# Patient Record
Sex: Female | Born: 1985 | Race: Black or African American | Hispanic: No | Marital: Single | Smoking: Never smoker
Health system: Southern US, Community
[De-identification: ages and names within clinical notes are randomized; demographics above are authoritative.]

## PROBLEM LIST (undated history)

## (undated) ENCOUNTER — Inpatient Hospital Stay (HOSPITAL_COMMUNITY): Payer: Self-pay

## (undated) HISTORY — PX: WISDOM TOOTH EXTRACTION: SHX21

---

## 2004-03-18 ENCOUNTER — Ambulatory Visit (HOSPITAL_COMMUNITY): Admission: RE | Admit: 2004-03-18 | Discharge: 2004-03-18 | Payer: Self-pay | Admitting: Urology

## 2005-03-18 IMAGING — RF DG VCUG
15 of 18 series · 15 of 18 positions shown · non-contrast
Comparison: none

CLINICAL DATA: Polyuria.  No evidence of urinary tract infections.
 RETROGRADE CYSTOGRAM
 The patient was catheterized with a Foley catheter.  The urinary bladder is well filled.  Patient experienced bladder fullness with only 200 cc being infused.  Approximately 300 cc was infused and the patient then voided around the catheter.  Bladder wall appears to be normal.  No irregularity of the bladder wall.  No reflux into the ureters.  Patient almost completely voided on the table.  Patient was then sent to the bathroom and voided.  Post void film reveals complete emptying of the bladder.  
 IMPRESSION
 No abnormality of the bladder with satisfactory voiding.   No reflux.    
 The patient did have a sense of the bladder being full with only 200 cc being infused into the bladder.

[Series 1: run · 1 of 1 slices shown (1 of 13)]
[im 1/1]
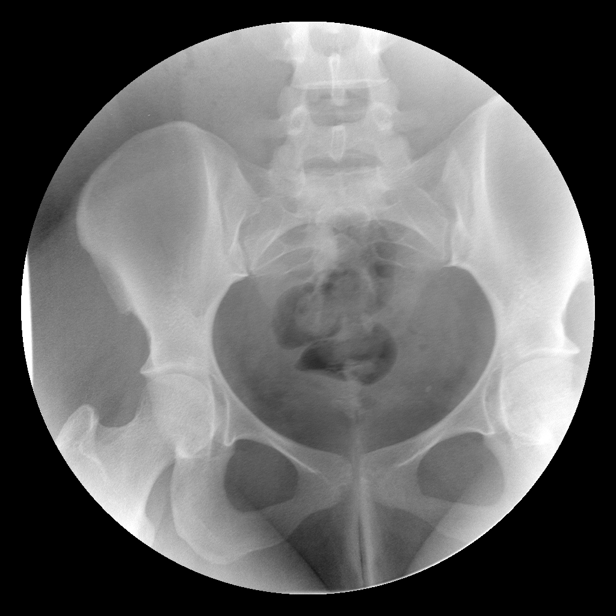

[Series 2: run · 1 of 1 slices shown (2 of 13)]
[im 1/1]
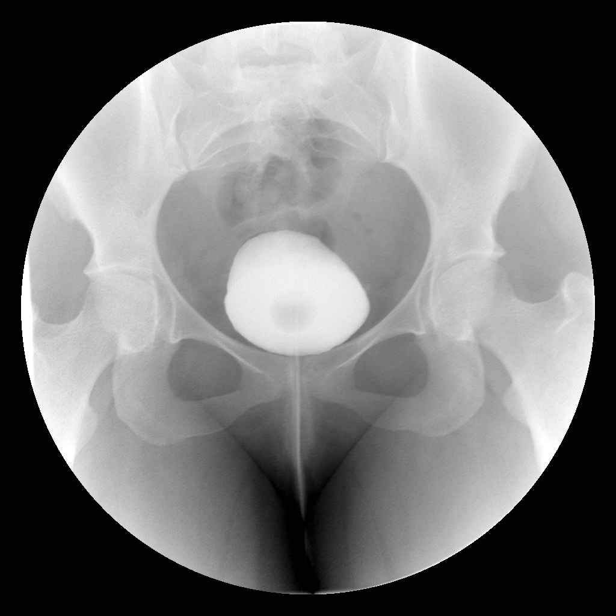

[Series 4: run · 1 of 1 slices shown (3 of 13)]
[im 1/1]
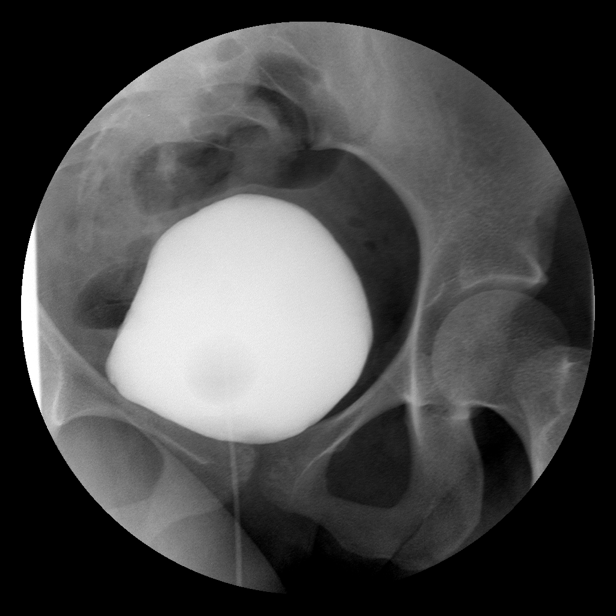

[Series 5: run · 1 of 1 slices shown (4 of 13)]
[im 1/1]
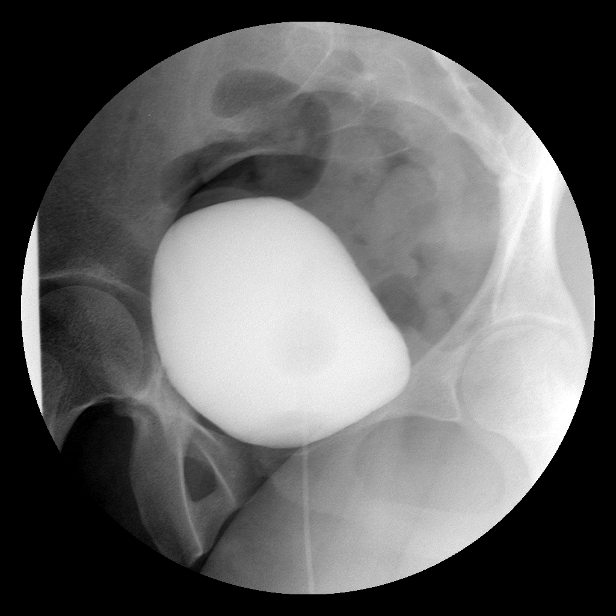

[Series 6: run · 1 of 1 slices shown (5 of 13)]
[im 1/1]
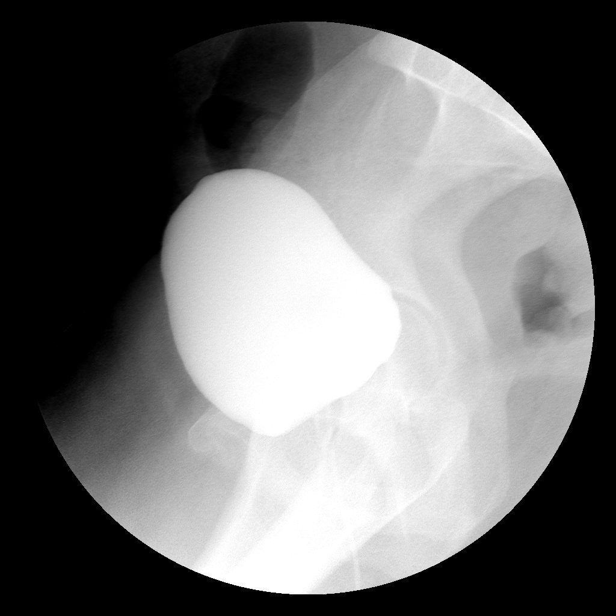

[Series 7: run · 1 of 1 slices shown (6 of 13)]
[im 1/1]
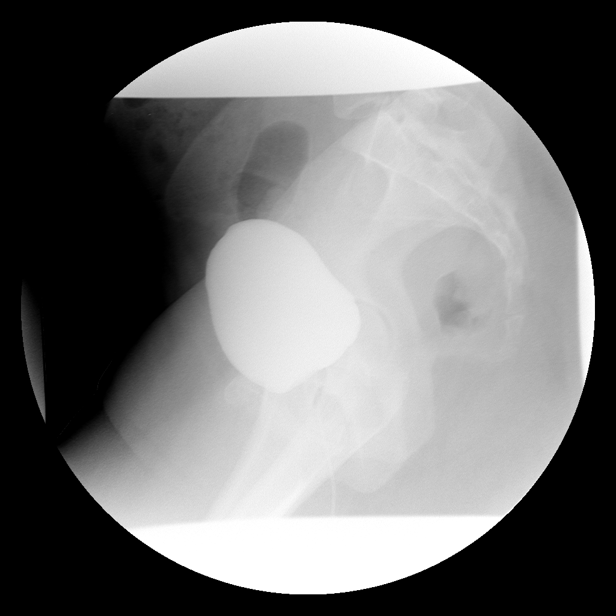

[Series 8: run · 1 of 1 slices shown (7 of 13)]
[im 1/1]
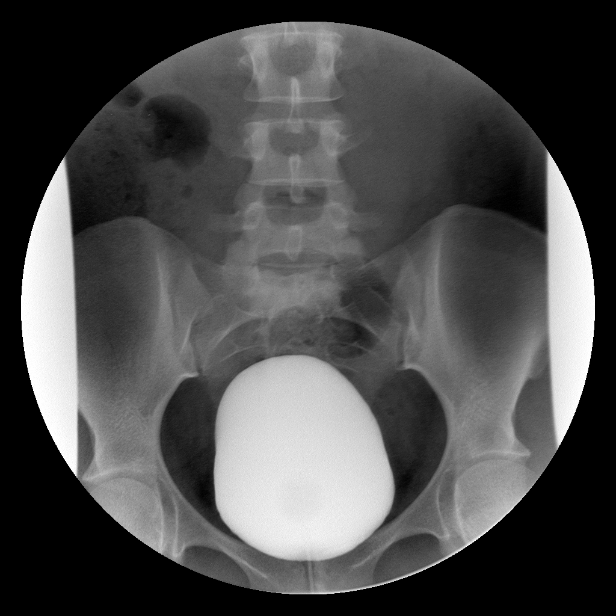

[Series 10: run · 1 of 1 slices shown (8 of 13)]
[im 1/1]
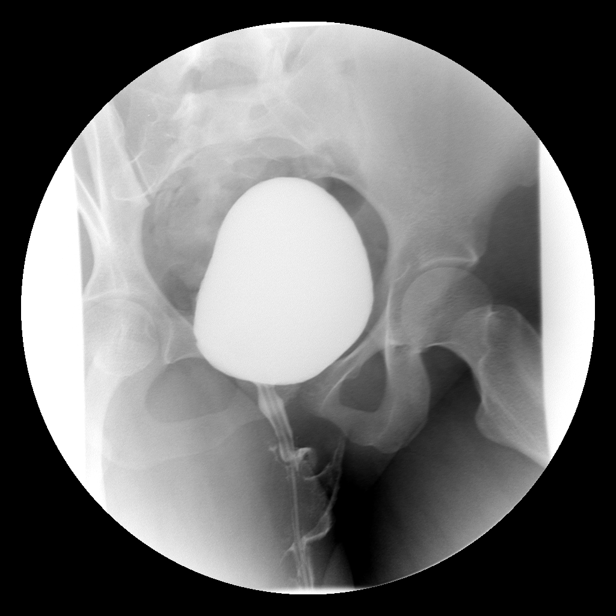

[Series 11: run · 1 of 1 slices shown (9 of 13)]
[im 1/1]
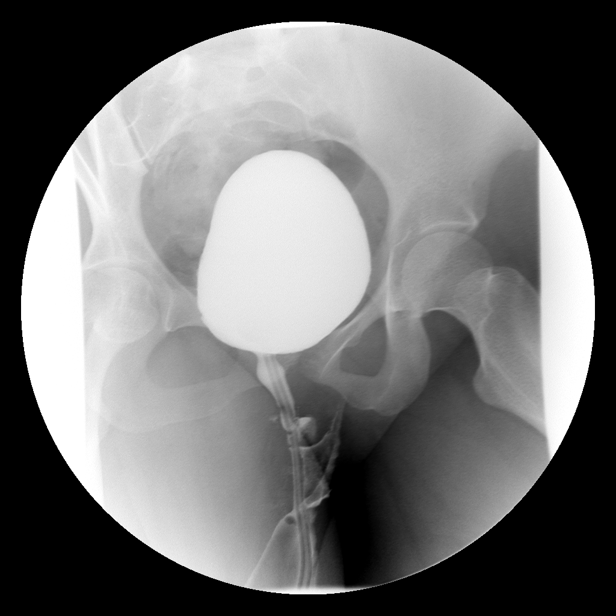

[Series 12: run · 1 of 1 slices shown (10 of 13)]
[im 1/1]
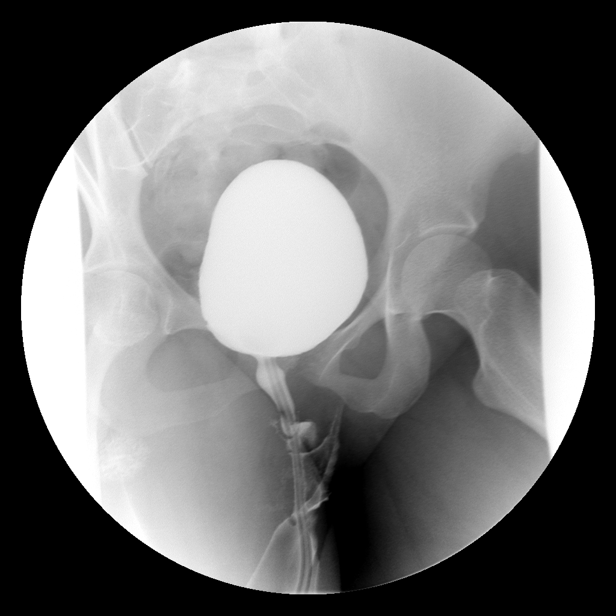

[Series 13: run · 1 of 1 slices shown (11 of 13)]
[im 1/1]
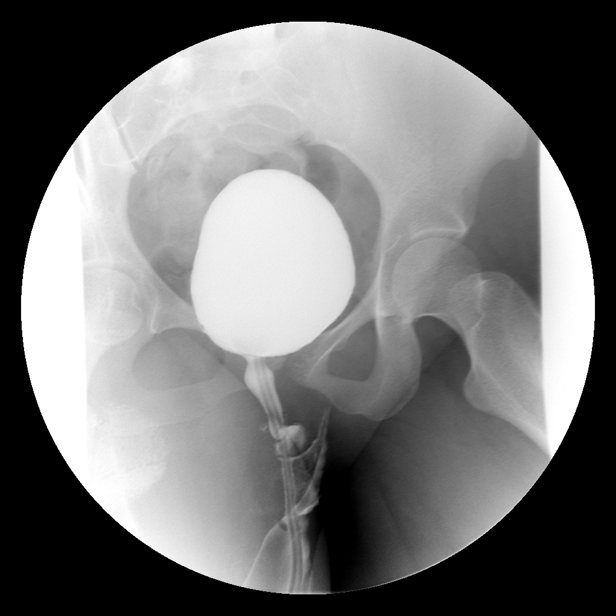

[Series 14: run · 1 of 1 slices shown (12 of 13)]
[im 1/1]
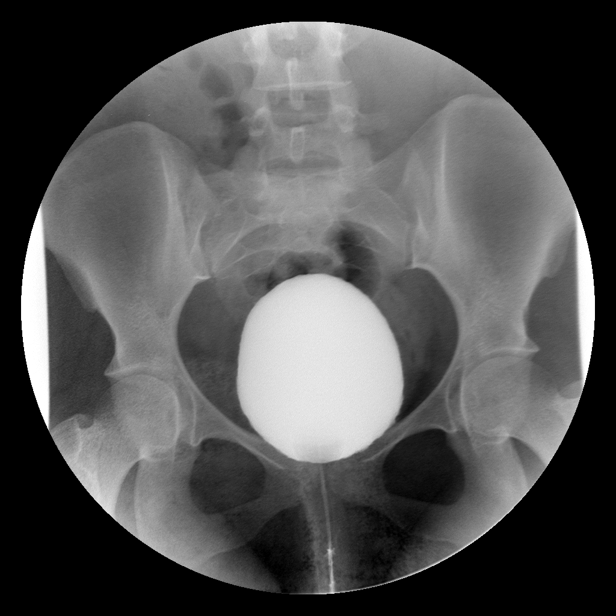

[Series 16: run · 1 of 1 slices shown (13 of 13)]
[im 1/1]
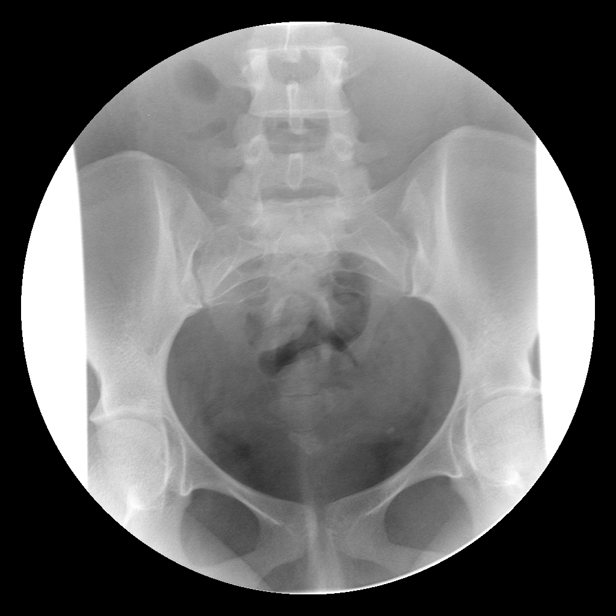

[Series 1001: view not recorded · 0.20mm/px · 1 of 1 slices shown (1 of 2)]
[im 1/1]
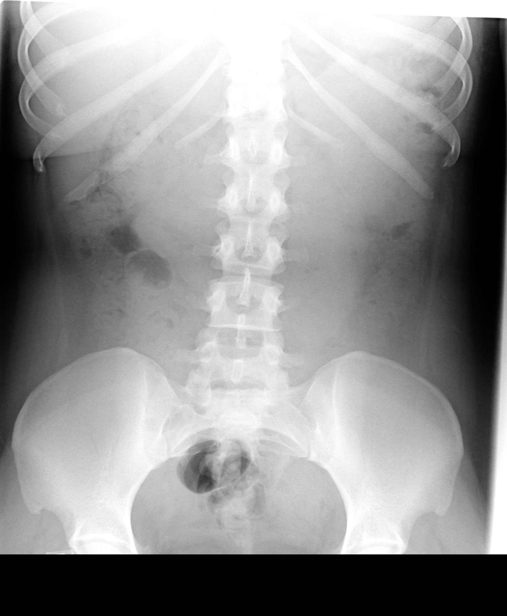

[Series 1002: view not recorded · 0.10mm/px · 1 of 1 slices shown (2 of 2)]
[im 1/1]
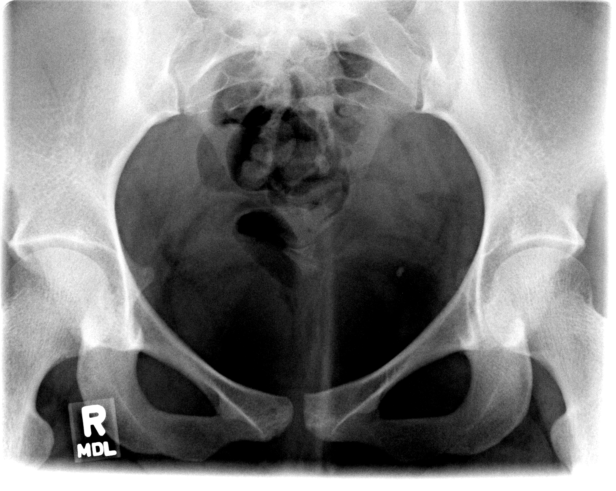

[15 of 18 positions shown; findings below may reference images not displayed]

## 2014-07-04 LAB — OB RESULTS CONSOLE ABO/RH: RH TYPE: POSITIVE

## 2014-07-04 LAB — OB RESULTS CONSOLE ANTIBODY SCREEN: Antibody Screen: NEGATIVE

## 2014-07-04 LAB — OB RESULTS CONSOLE RUBELLA ANTIBODY, IGM: Rubella: IMMUNE

## 2014-07-04 LAB — OB RESULTS CONSOLE HEPATITIS B SURFACE ANTIGEN: HEP B S AG: NEGATIVE

## 2014-07-04 LAB — OB RESULTS CONSOLE HIV ANTIBODY (ROUTINE TESTING): HIV: NONREACTIVE

## 2014-07-04 LAB — OB RESULTS CONSOLE RPR: RPR: NONREACTIVE

## 2014-07-21 LAB — OB RESULTS CONSOLE GC/CHLAMYDIA
Chlamydia: NEGATIVE
Gonorrhea: NEGATIVE

## 2014-09-26 NOTE — L&D Delivery Note (Signed)
Delivery Note At 11:37 AM a viable and healthy female was delivered via Vaginal, Spontaneous Delivery (Presentation: Left Occiput Anterior).  APGAR: 8, 9; weight  .   Placenta status: Intact, Spontaneous.  Cord: 3 vessels with the following complications: None.  Cord pH: na  Anesthesia: Epidural  Episiotomy: None Lacerations:  second Suture Repair: 2.0 vicryl rapide Est. Blood Loss (mL):  100  Mom to postpartum.  Baby to Couplet care / Skin to Skin.  Seville Downs J 03/01/2015, 11:56 AM

## 2014-12-15 ENCOUNTER — Inpatient Hospital Stay (HOSPITAL_COMMUNITY)
Admission: AD | Admit: 2014-12-15 | Discharge: 2014-12-15 | Disposition: A | Discharge status: Home / Self Care | Payer: BC Managed Care – PPO | Source: Ambulatory Visit | Attending: Obstetrics | Admitting: Obstetrics

## 2014-12-15 ENCOUNTER — Encounter (HOSPITAL_COMMUNITY): Payer: Self-pay | Admitting: *Deleted

## 2014-12-15 DIAGNOSIS — W000XXA Fall on same level due to ice and snow, initial encounter: Secondary | ICD-10-CM | POA: Diagnosis not present

## 2014-12-15 DIAGNOSIS — Z3A29 29 weeks gestation of pregnancy: Secondary | ICD-10-CM | POA: Insufficient documentation

## 2014-12-15 DIAGNOSIS — Y92008 Other place in unspecified non-institutional (private) residence as the place of occurrence of the external cause: Secondary | ICD-10-CM | POA: Diagnosis not present

## 2014-12-15 DIAGNOSIS — W19XXXA Unspecified fall, initial encounter: Secondary | ICD-10-CM | POA: Diagnosis present

## 2014-12-15 DIAGNOSIS — Z3493 Encounter for supervision of normal pregnancy, unspecified, third trimester: Secondary | ICD-10-CM

## 2014-12-15 DIAGNOSIS — O9989 Other specified diseases and conditions complicating pregnancy, childbirth and the puerperium: Secondary | ICD-10-CM | POA: Insufficient documentation

## 2014-12-15 LAB — URINALYSIS, ROUTINE W REFLEX MICROSCOPIC
Bilirubin Urine: NEGATIVE
GLUCOSE, UA: NEGATIVE mg/dL
HGB URINE DIPSTICK: NEGATIVE
Ketones, ur: NEGATIVE mg/dL
LEUKOCYTES UA: NEGATIVE
Nitrite: NEGATIVE
PROTEIN: NEGATIVE mg/dL
Urobilinogen, UA: 0.2 mg/dL (ref 0.0–1.0)
pH: 5.5 (ref 5.0–8.0)

## 2014-12-15 NOTE — MAU Provider Note (Signed)
  History     CSN: [id number]  Arrival date and time: 12/15/14 [id number] Provider notified of pt arrival @0823  Provider at bedside @0830     Chief Complaint  Patient presents with  . Fall   HPI Comments: G2P0010 @29 .5 wks s/p fall this am. Reports walked out onto deck stairs, stepped onto icy patch, slipped and fell onto right buttock. No abdominal contact. Good FM. No abd pain, cramping, or ctx. No VB or LOF. Feels fine except buttock sore. Pregnancy uncomplicated.    OB History    Gravida Para Term Preterm AB TAB SAB Ectopic Multiple Living   2 0 0 0 1 1    0      History reviewed. No pertinent past medical history.  History reviewed. No pertinent past surgical history.  History reviewed. No pertinent family history.  History  Substance Use Topics  . Smoking status: Never Smoker   . Smokeless tobacco: Never Used  . Alcohol Use: No    Allergies:  Allergies  Allergen Reactions  . Other Swelling    Tongue swelling with big red gum    Prescriptions prior to admission  Medication Sig Dispense Refill Last Dose  . fluticasone (FLONASE) 50 MCG/ACT nasal spray Place 2 sprays into both nostrils daily as needed for allergies or rhinitis.   Past Month at Unknown time  . Prenatal Vit-Fe Fumarate-FA (PRENATAL MULTIVITAMIN) TABS tablet Take 1 tablet by mouth at bedtime.    12/14/2014 at Unknown time  . Vitamin D, Ergocalciferol, (DRISDOL) 50000 UNITS CAPS capsule Take 50,000 Units by mouth every 7 (seven) days. monday   Past Week at Unknown time    Review of Systems  Constitutional: Negative.   HENT: Negative.   Eyes: Negative.   Respiratory: Negative.   Cardiovascular: Negative.   Gastrointestinal: Negative.   Genitourinary: Negative.   Musculoskeletal: Negative.   Skin: Negative.   Neurological: Negative.   Endo/Heme/Allergies: Negative.   Psychiatric/Behavioral: Negative.    Physical Exam   Blood pressure 111/65, pulse 93, temperature 98 F (36.7 C), resp. rate 18,  last menstrual period 05/21/2014.  Physical Exam  Constitutional: She is oriented to person, place, and time. She appears well-developed and well-nourished.  HENT:  Head: Normocephalic and atraumatic.  Eyes: Pupils are equal, round, and reactive to light.  Neck: Normal range of motion. Neck supple.  Cardiovascular: Normal rate and regular rhythm.   Respiratory: Effort normal and breath sounds normal.  GI: Soft. Bowel sounds are normal.  gravid  Genitourinary:  deferred  Musculoskeletal: Normal range of motion.  Neurological: She is alert and oriented to person, place, and time.  Skin: Skin is warm and dry.  Psychiatric: She has a normal mood and affect.  EFM: 145 bpm, mod variability, +accels, no decels Toco: none  MAU Course  Procedures  Prolonged monitoring-no evidence of abruption/PTL NST   Assessment and Plan  29.[redacted] weeks gestation S/p fall-no evidence of trauma or abruption Reactive NST Rh positive  Discharge home PTL precautions Rest today May use Tylenol prn minor pain Follow-up with Dr. Juliene PinaMody as scheduled  Dr. Ernestina PennaFogleman notified of A/P.  Denyse AmassBHAMBRI, Tabbetha Kutscher, N 12/15/2014, 8:45 AM

## 2014-12-15 NOTE — Progress Notes (Signed)
Fabian NovemberM. Bhambri CNM notified of pt's arrival and complaints, states she will come and evaluate pt

## 2014-12-15 NOTE — MAU Note (Signed)
Pt presents to MAU with complaints of falling this morning after slipping on ice. She states that she landed on her buttocks. Denies any vaginal bleeding or pain

## 2014-12-15 NOTE — Discharge Instructions (Signed)
Braxton Hicks Contractions °Contractions of the uterus can occur throughout pregnancy. Contractions are not always a sign that you are in labor.  °WHAT ARE BRAXTON HICKS CONTRACTIONS?  °Contractions that occur before labor are called Braxton Hicks contractions, or false labor. Toward the end of pregnancy (32-34 weeks), these contractions can develop more often and may become more forceful. This is not true labor because these contractions do not result in opening (dilatation) and thinning of the cervix. They are sometimes difficult to tell apart from true labor because these contractions can be forceful and people have different pain tolerances. You should not feel embarrassed if you go to the hospital with false labor. Sometimes, the only way to tell if you are in true labor is for your health care provider to look for changes in the cervix. °If there are no prenatal problems or other health problems associated with the pregnancy, it is completely safe to be sent home with false labor and await the onset of true labor. °HOW CAN YOU TELL THE DIFFERENCE BETWEEN TRUE AND FALSE LABOR? °False Labor °· The contractions of false labor are usually shorter and not as hard as those of true labor.   °· The contractions are usually irregular.   °· The contractions are often felt in the front of the lower abdomen and in the groin.   °· The contractions may go away when you walk around or change positions while lying down.   °· The contractions get weaker and are shorter lasting as time goes on.   °· The contractions do not usually become progressively stronger, regular, and closer together as with true labor.   °True Labor °· Contractions in true labor last 30-70 seconds, become very regular, usually become more intense, and increase in frequency.   °· The contractions do not go away with walking.   °· The discomfort is usually felt in the top of the uterus and spreads to the lower abdomen and low back.   °· True labor can be  determined by your health care provider with an exam. This will show that the cervix is dilating and getting thinner.   °WHAT TO REMEMBER °· Keep up with your usual exercises and follow other instructions given by your health care provider.   °· Take medicines as directed by your health care provider.   °· Keep your regular prenatal appointments.   °· Eat and drink lightly if you think you are going into labor.   °· If Braxton Hicks contractions are making you uncomfortable:   °¨ Change your position from lying down or resting to walking, or from walking to resting.   °¨ Sit and rest in a tub of warm water.   °¨ Drink 2-3 glasses of water. Dehydration may cause these contractions.   °¨ Do slow and deep breathing several times an hour.   °WHEN SHOULD I SEEK IMMEDIATE MEDICAL CARE? °Seek immediate medical care if: °· Your contractions become stronger, more regular, and closer together.   °· You have fluid leaking or gushing from your vagina.   °· You have a fever.   °· You pass blood-tinged mucus.   °· You have vaginal bleeding.   °· You have continuous abdominal pain.   °· You have low back pain that you never had before.   °· You feel your baby's head pushing down and causing pelvic pressure.   °· Your baby is not moving as much as it used to.   °Document Released: 09/12/2005 Document Revised: 09/17/2013 Document Reviewed: 06/24/2013 °[employer]® Patient Information ©2015 [employer]. This information is not intended to replace advice given to you by your health care   provider. Make sure you discuss any questions you have with your health care provider. ° °

## 2015-01-29 LAB — OB RESULTS CONSOLE GBS: STREP GROUP B AG: NEGATIVE

## 2015-03-01 ENCOUNTER — Inpatient Hospital Stay (HOSPITAL_COMMUNITY): Payer: BC Managed Care – PPO | Admitting: Anesthesiology

## 2015-03-01 ENCOUNTER — Encounter (HOSPITAL_COMMUNITY): Payer: Self-pay | Admitting: *Deleted

## 2015-03-01 ENCOUNTER — Inpatient Hospital Stay (HOSPITAL_COMMUNITY)
Admission: AD | Admit: 2015-03-01 | Discharge: 2015-03-03 | DRG: 775 | Disposition: A | Discharge status: Home / Self Care | Payer: BC Managed Care – PPO | Source: Ambulatory Visit | Attending: Obstetrics and Gynecology | Admitting: Obstetrics and Gynecology

## 2015-03-01 DIAGNOSIS — Z3A40 40 weeks gestation of pregnancy: Secondary | ICD-10-CM | POA: Diagnosis present

## 2015-03-01 DIAGNOSIS — IMO0001 Reserved for inherently not codable concepts without codable children: Secondary | ICD-10-CM

## 2015-03-01 LAB — CBC
HEMATOCRIT: 36.9 % (ref 36.0–46.0)
Hemoglobin: 12.1 g/dL (ref 12.0–15.0)
MCH: 29.1 pg (ref 26.0–34.0)
MCHC: 32.8 g/dL (ref 30.0–36.0)
MCV: 88.7 fL (ref 78.0–100.0)
Platelets: 195 10*3/uL (ref 150–400)
RBC: 4.16 MIL/uL (ref 3.87–5.11)
RDW: 15.1 % (ref 11.5–15.5)
WBC: 11.1 10*3/uL — ABNORMAL HIGH (ref 4.0–10.5)

## 2015-03-01 LAB — TYPE AND SCREEN
ABO/RH(D): O POS
ANTIBODY SCREEN: NEGATIVE

## 2015-03-01 LAB — ABO/RH: ABO/RH(D): O POS

## 2015-03-01 LAB — RPR: RPR Ser Ql: NONREACTIVE

## 2015-03-01 MED ORDER — WITCH HAZEL-GLYCERIN EX PADS: 1.0000 "application " | MEDICATED_PAD | CUTANEOUS | Status: DC | PRN

## 2015-03-01 MED ORDER — FENTANYL 2.5 MCG/ML BUPIVACAINE 1/10 % EPIDURAL INFUSION (WH - ANES)
14.0000 mL/h | INTRAMUSCULAR | Status: DC | PRN
  Administered 2015-03-01: 12 mL/h via EPIDURAL
  Filled 2015-03-01: qty 125

## 2015-03-01 MED ORDER — LIDOCAINE HCL (PF) 1 % IJ SOLN
30.0000 mL | INTRAMUSCULAR | Status: DC | PRN
  Filled 2015-03-01: qty 30

## 2015-03-01 MED ORDER — PRENATAL MULTIVITAMIN CH
1.0000 | ORAL_TABLET | Freq: Every day | ORAL | Status: DC
  Administered 2015-03-02 – 2015-03-03 (×2): 1 via ORAL
  Filled 2015-03-01 (×2): qty 1

## 2015-03-01 MED ORDER — BENZOCAINE-MENTHOL 20-0.5 % EX AERO
1.0000 "application " | INHALATION_SPRAY | CUTANEOUS | Status: DC | PRN
  Filled 2015-03-01: qty 56

## 2015-03-01 MED ORDER — ACETAMINOPHEN 325 MG PO TABS: 650.0000 mg | ORAL_TABLET | ORAL | Status: DC | PRN

## 2015-03-01 MED ORDER — METHYLERGONOVINE MALEATE 0.2 MG/ML IJ SOLN: 0.2000 mg | INTRAMUSCULAR | Status: DC | PRN

## 2015-03-01 MED ORDER — OXYTOCIN 40 UNITS IN LACTATED RINGERS INFUSION - SIMPLE MED
62.5000 mL/h | INTRAVENOUS | Status: DC
Start: 2015-03-01 — End: 2015-03-01
  Filled 2015-03-01: qty 1000

## 2015-03-01 MED ORDER — DIPHENHYDRAMINE HCL 50 MG/ML IJ SOLN: 12.5000 mg | INTRAMUSCULAR | Status: DC | PRN

## 2015-03-01 MED ORDER — ONDANSETRON HCL 4 MG/2ML IJ SOLN: 4.0000 mg | Freq: Four times a day (QID) | INTRAMUSCULAR | Status: DC | PRN

## 2015-03-01 MED ORDER — TETANUS-DIPHTH-ACELL PERTUSSIS 5-2.5-18.5 LF-MCG/0.5 IM SUSP: 0.5000 mL | Freq: Once | INTRAMUSCULAR | Status: DC

## 2015-03-01 MED ORDER — CITRIC ACID-SODIUM CITRATE 334-500 MG/5ML PO SOLN: 30.0000 mL | ORAL | Status: DC | PRN

## 2015-03-01 MED ORDER — ZOLPIDEM TARTRATE 5 MG PO TABS: 5.0000 mg | ORAL_TABLET | Freq: Every evening | ORAL | Status: DC | PRN

## 2015-03-01 MED ORDER — SENNOSIDES-DOCUSATE SODIUM 8.6-50 MG PO TABS
2.0000 | ORAL_TABLET | ORAL | Status: DC
  Administered 2015-03-02 (×2): 2 via ORAL
  Filled 2015-03-01 (×2): qty 2

## 2015-03-01 MED ORDER — OXYCODONE-ACETAMINOPHEN 5-325 MG PO TABS: 1.0000 | ORAL_TABLET | ORAL | Status: DC | PRN

## 2015-03-01 MED ORDER — LACTATED RINGERS IV SOLN
500.0000 mL | INTRAVENOUS | Status: DC | PRN
  Administered 2015-03-01 (×2): 500 mL via INTRAVENOUS

## 2015-03-01 MED ORDER — LACTATED RINGERS IV SOLN
INTRAVENOUS | Status: DC
  Administered 2015-03-01 (×3): via INTRAVENOUS

## 2015-03-01 MED ORDER — OXYTOCIN BOLUS FROM INFUSION
500.0000 mL | INTRAVENOUS | Status: DC
  Administered 2015-03-01: 500 mL via INTRAVENOUS

## 2015-03-01 MED ORDER — PHENYLEPHRINE 40 MCG/ML (10ML) SYRINGE FOR IV PUSH (FOR BLOOD PRESSURE SUPPORT)
80.0000 ug | PREFILLED_SYRINGE | INTRAVENOUS | Status: DC | PRN
  Administered 2015-03-01: 80 ug via INTRAVENOUS
  Filled 2015-03-01: qty 20
  Filled 2015-03-01: qty 2

## 2015-03-01 MED ORDER — ONDANSETRON HCL 4 MG/2ML IJ SOLN: 4.0000 mg | INTRAMUSCULAR | Status: DC | PRN

## 2015-03-01 MED ORDER — ACETAMINOPHEN 325 MG PO TABS
650.0000 mg | ORAL_TABLET | ORAL | Status: DC | PRN
  Administered 2015-03-01: 650 mg via ORAL
  Filled 2015-03-01: qty 2

## 2015-03-01 MED ORDER — IBUPROFEN 600 MG PO TABS
600.0000 mg | ORAL_TABLET | Freq: Four times a day (QID) | ORAL | Status: DC
  Administered 2015-03-01 – 2015-03-03 (×8): 600 mg via ORAL
  Filled 2015-03-01 (×8): qty 1

## 2015-03-01 MED ORDER — EPHEDRINE 5 MG/ML INJ
10.0000 mg | INTRAVENOUS | Status: DC | PRN
  Filled 2015-03-01: qty 2

## 2015-03-01 MED ORDER — LIDOCAINE-EPINEPHRINE (PF) 2 %-1:200000 IJ SOLN
INTRAMUSCULAR | Status: DC | PRN
  Administered 2015-03-01: 3 mL

## 2015-03-01 MED ORDER — METHYLERGONOVINE MALEATE 0.2 MG PO TABS: 0.2000 mg | ORAL_TABLET | ORAL | Status: DC | PRN

## 2015-03-01 MED ORDER — BUPIVACAINE HCL (PF) 0.25 % IJ SOLN
INTRAMUSCULAR | Status: DC | PRN
  Administered 2015-03-01 (×2): 4 mL

## 2015-03-01 MED ORDER — LANOLIN HYDROUS EX OINT: TOPICAL_OINTMENT | CUTANEOUS | Status: DC | PRN

## 2015-03-01 MED ORDER — OXYCODONE-ACETAMINOPHEN 5-325 MG PO TABS
1.0000 | ORAL_TABLET | ORAL | Status: DC | PRN
Start: 2015-03-01 — End: 2015-03-03

## 2015-03-01 MED ORDER — OXYCODONE-ACETAMINOPHEN 5-325 MG PO TABS: 2.0000 | ORAL_TABLET | ORAL | Status: DC | PRN

## 2015-03-01 MED ORDER — ONDANSETRON HCL 4 MG PO TABS: 4.0000 mg | ORAL_TABLET | ORAL | Status: DC | PRN

## 2015-03-01 MED ORDER — DIPHENHYDRAMINE HCL 25 MG PO CAPS: 25.0000 mg | ORAL_CAPSULE | Freq: Four times a day (QID) | ORAL | Status: DC | PRN

## 2015-03-01 MED ORDER — DIBUCAINE 1 % RE OINT: 1.0000 "application " | TOPICAL_OINTMENT | RECTAL | Status: DC | PRN

## 2015-03-01 MED ORDER — FLEET ENEMA 7-19 GM/118ML RE ENEM: 1.0000 | ENEMA | Freq: Every day | RECTAL | Status: DC | PRN

## 2015-03-01 MED ORDER — SIMETHICONE 80 MG PO CHEW: 80.0000 mg | CHEWABLE_TABLET | ORAL | Status: DC | PRN

## 2015-03-01 NOTE — MAU Note (Signed)
Contractions since 2300. Denies LOF or bleeding. 3cm on THursday

## 2015-03-01 NOTE — Anesthesia Procedure Notes (Signed)
Epidural Patient location during procedure: OB  Staffing Anesthesiologist: Gladies Sofranko, CHRIS Performed by: anesthesiologist   Preanesthetic Checklist Completed: patient identified, surgical consent, pre-op evaluation, timeout performed, IV checked, risks and benefits discussed and monitors and equipment checked  Epidural Patient position: sitting Prep: site prepped and draped and DuraPrep Patient monitoring: heart rate, cardiac monitor, continuous pulse ox and blood pressure Approach: midline Location: L3-L4 Injection technique: LOR saline  Needle:  Needle type: Tuohy  Needle gauge: 17 G Needle length: 9 cm Needle insertion depth: 7 cm Catheter type: closed end flexible Catheter size: 19 Gauge Catheter at skin depth: 13 cm Test dose: negative and 2% lidocaine with Epi 1:200 K  Assessment Events: blood not aspirated, injection not painful, no injection resistance, negative IV test and no paresthesia  Additional Notes H+P and labs checked, risks and benefits discussed with the patient, consent obtained, procedure tolerated well and without complications.  Reason for block:procedure for pain   

## 2015-03-01 NOTE — Anesthesia Preprocedure Evaluation (Addendum)
Anesthesia Evaluation  Patient identified by MRN, date of birth, ID band Patient awake    Reviewed: Allergy & Precautions, NPO status , Patient's Chart, lab work & pertinent test results  History of Anesthesia Complications Negative for: history of anesthetic complications  Airway Mallampati: II  TM Distance: >3 FB Neck ROM: Full    Dental  (+) Teeth Intact   Pulmonary neg pulmonary ROS,  breath sounds clear to auscultation        Cardiovascular negative cardio ROS  Rhythm:Regular     Neuro/Psych negative neurological ROS  negative psych ROS   GI/Hepatic negative GI ROS, Neg liver ROS,   Endo/Other  negative endocrine ROS  Renal/GU negative Renal ROS     Musculoskeletal   Abdominal   Peds  Hematology negative hematology ROS (+)   Anesthesia Other Findings   Reproductive/Obstetrics (+) Pregnancy                             Anesthesia Physical Anesthesia Plan  ASA: II  Anesthesia Plan: Epidural   Post-op Pain Management:    Induction:   Airway Management Planned: Natural Airway  Additional Equipment:   Intra-op Plan:   Post-operative Plan:   Informed Consent: I have reviewed the patients History and Physical, chart, labs and discussed the procedure including the risks, benefits and alternatives for the proposed anesthesia with the patient or authorized representative who has indicated his/her understanding and acceptance.     Plan Discussed with: Anesthesiologist  Anesthesia Plan Comments:        Anesthesia Quick Evaluation

## 2015-03-01 NOTE — Plan of Care (Signed)
Problem: Consults Goal: Birthing Suites Patient Information Press F2 to bring up selections list Outcome: Completed/Met Date Met:  03/01/15  Pt > [redacted] weeks EGA

## 2015-03-01 NOTE — H&P (Signed)
Shannon Graham is a 29 y.o. female presenting for active labor. Maternal Medical History:  Reason for admission: Contractions.   Contractions: Onset was less than 1 hour ago.   Frequency: regular.   Perceived severity is moderate.    Fetal activity: Perceived fetal activity is normal.    Prenatal complications: no prenatal complications Prenatal Complications - Diabetes: none.    OB History    Gravida Para Term Preterm AB TAB SAB Ectopic Multiple Living   2 0 0 0 1 1    0     History reviewed. No pertinent past medical history. Past Surgical History  Procedure Laterality Date  . Wisdom tooth extraction     Family History: family history is not on file. Social History:  reports that she has never smoked. She has never used smokeless tobacco. She reports that she does not drink alcohol or use illicit drugs.   Prenatal Transfer Tool  Maternal Diabetes: No Genetic Screening: Normal Maternal Ultrasounds/Referrals: Normal Fetal Ultrasounds or other Referrals:  None Maternal Substance Abuse:  No Significant Maternal Medications:  None Significant Maternal Lab Results:  None Other Comments:  None  Review of Systems  Constitutional: Negative.     Dilation: Lip/rim Effacement (%): 100 Station: -1 Exam by:: e. poore, rn Blood pressure 109/72, pulse 91, temperature 98.8 F (37.1 C), temperature source Oral, resp. rate 18, height 4\' 11"  (1.499 m), weight 82.283 kg (181 lb 6.4 oz), last menstrual period 05/21/2014, SpO2 100 %. Maternal Exam:  Uterine Assessment: Contraction strength is mild.  Contraction frequency is regular.   Abdomen: Patient reports no abdominal tenderness. Fetal presentation: vertex  Introitus: Normal vulva. Normal vagina.  Ferning test: not done.  Nitrazine test: not done. Amniotic fluid character: not assessed.  Pelvis: questionable for delivery.   Cervix: Cervix evaluated by digital exam.     Physical Exam  Nursing note and vitals  reviewed. Constitutional: She is oriented to person, place, and time. She appears well-developed and well-nourished.  HENT:  Head: Normocephalic and atraumatic.  Neck: Normal range of motion. Neck supple.  Cardiovascular: Normal rate and regular rhythm.   Respiratory: Effort normal and breath sounds normal.  GI: Soft. Bowel sounds are normal.  Genitourinary: Vagina normal and uterus normal.  Musculoskeletal: Normal range of motion.  Neurological: She is alert and oriented to person, place, and time. She has normal reflexes.  Skin: Skin is warm and dry.  Psychiatric: She has a normal mood and affect. Her behavior is normal. Judgment and thought content normal.    Prenatal labs: ABO, Rh: --/--/O POS, O POS (06/05 0255) Antibody: NEG (06/05 0255) Rubella: Immune (10/09 0000) RPR: Nonreactive (10/09 0000)  HBsAg: Negative (10/09 0000)  HIV: Non-reactive (10/09 0000)  GBS: Negative (05/05 0000)   Assessment/Plan: Term IUP Active labor Borderline pelvimetry Admit   Sylvester Minton J 03/01/2015, 7:16 AM

## 2015-03-01 NOTE — Progress Notes (Signed)
Shannon Graham is a 29 y.o. G2P0010 at 4738w4d by LMP admitted for active labor  Subjective: comfortable  Objective: BP 108/69 mmHg  Pulse 80  Temp(Src) 98.8 F (37.1 C) (Oral)  Resp 18  Ht 4\' 11"  (1.499 m)  Wt 82.283 kg (181 lb 6.4 oz)  BMI 36.62 kg/m2  SpO2 100%  LMP 05/21/2014      FHT:  FHR: 145 bpm, variability: moderate,  accelerations:  Present,  decelerations:  Absent UC:   irregular, every 1-3 minutes SVE:   Dilation: Lip/rim Effacement (%): 100 Station: 0 Exam by:: Dr. Billy Coastaavon  AROM- clear  Labs: Lab Results  Component Value Date   WBC 11.1* 03/01/2015   HGB 12.1 03/01/2015   HCT 36.9 03/01/2015   MCV 88.7 03/01/2015   PLT 195 03/01/2015    Assessment / Plan: Spontaneous labor, progressing normally  Labor: Progressing normally Preeclampsia:  no signs or symptoms of toxicity Fetal Wellbeing:  Category I Pain Control:  Epidural I/D:  n/a Anticipated MOD:  NSVD  Shannon Graham J 03/01/2015, 9:31 AM

## 2015-03-02 LAB — CBC
HCT: 31.9 % — ABNORMAL LOW (ref 36.0–46.0)
HEMOGLOBIN: 10.4 g/dL — AB (ref 12.0–15.0)
MCH: 28.8 pg (ref 26.0–34.0)
MCHC: 32.6 g/dL (ref 30.0–36.0)
MCV: 88.4 fL (ref 78.0–100.0)
PLATELETS: 173 10*3/uL (ref 150–400)
RBC: 3.61 MIL/uL — ABNORMAL LOW (ref 3.87–5.11)
RDW: 15 % (ref 11.5–15.5)
WBC: 11 10*3/uL — AB (ref 4.0–10.5)

## 2015-03-02 NOTE — Lactation Note (Signed)
This note was copied from the chart of Shannon Niti Boileau. Lactation Consultation Note; Lactation Brochure given with basic teaching done. Mother asking lots of questions about early colostrum. Advised mother that colostrum is the first milk and that infant needed to be offered feedings 8-12 times in 24 hours as well as with feeding cues. Mother states that she thinks infant is cluster feeding. Mother states she doesn't want to wake infant for feeding now. Reviewed cue card and reviewed Baby and Me book on breastfeeding information. Mother to page for next feed for assistance.   Patient Name: Shannon Graham'UToday's Date: 03/02/2015 Reason for consult: Initial assessment   Maternal Data Has patient been taught Hand Expression?: Yes Does the patient have breastfeeding experience prior to this delivery?: No  Feeding Feeding Type: Breast Fed  LATCH Score/Interventions                      Lactation Tools Discussed/Used     Consult Status Consult Status: Follow-up Date: 03/02/15 Follow-up type: In-patient    Stevan BornKendrick, Tarik [facility] 03/02/2015, 12:22 PM

## 2015-03-02 NOTE — Progress Notes (Signed)
PPD 1 SVD  S:  Reports feeling well             Tolerating po/ No nausea or vomiting             Bleeding is light             Pain controlled with motrin mostly             Up ad lib / ambulatory / voiding QS  Newborn breast feeding  O:               VS: BP 98/66 mmHg  Pulse 78  Temp(Src) 98.2 F (36.8 C) (Oral)  Resp 18  Ht 4\' 11"  (1.499 m)  Wt 82.283 kg (181 lb 6.4 oz)  BMI 36.62 kg/m2  SpO2 100%  LMP 05/21/2014  Breastfeeding? Unknown   LABS:              Recent Labs  03/01/15 0255 03/02/15 0550  WBC 11.1* 11.0*  HGB 12.1 10.4*  PLT 195 173               Blood type: --/--/O POS, O POS (06/05 0255)  Rubella: Immune (10/09 0000)                     I&O: Intake/Output      06/05 0701 - 06/06 0700 06/06 0701 - 06/07 0700   Urine (mL/kg/hr) 800 (0.4)    Blood 141 (0.1)    Total Output 941     Net -941                        Physical Exam:             Alert and oriented X3  Abdomen: soft, non-tender, non-distended              Fundus: firm, non-tender, U-1  Perineum: mild edema / ice pack in place  Lochia: light  Extremities: trace edema, no calf pain or tenderness    A: PPD # 1   Doing well - stable status  P: Routine post partum orders  DC tomorrow  Marlinda MikeBAILEY, Shaquia Berkley CNM, MSN, [facility] 03/02/2015, 8:41 AM

## 2015-03-02 NOTE — Progress Notes (Signed)
Patient and her parents attended 'Well After Birth' group class which presented discharge care information for both Mom and Baby.

## 2015-03-02 NOTE — Anesthesia Postprocedure Evaluation (Signed)
  Anesthesia Post-op Note  Patient: Engineer, [name]  Procedure(s) Performed: * No procedures listed *  Patient Location: Mother/Baby  Anesthesia Type:Epidural  Level of Consciousness: awake  Airway and Oxygen Therapy: Patient Spontanous Breathing  Post-op Pain: mild  Post-op Assessment: Patient's Cardiovascular Status Stable and Respiratory Function Stable  Post-op Vital Signs: stable  Last Vitals:  Filed Vitals:   03/02/15 0646  BP: 98/66  Pulse: 78  Temp: 36.8 C  Resp: 18    Complications: No apparent anesthesia complications

## 2015-03-02 NOTE — Lactation Note (Addendum)
This note was copied from the chart of Shannon Herberta Asbill. Lactation Consultation Note  Reviewed hand expression. Assisted mother with football hold.  Encouraged mother to support her breast and compress. Sucks and some swallows observed.  Had to reposition baby for more depth during feeding because baby slipped down on nipple and compressed nipple. With repositioning mother had increased comfort. Reviewed cluster feeding, pacifier use, basics, supply and demand. Mom encouraged to feed baby 8-12 times/24 hours and with feeding cues.      Patient Name: Shannon Graham ZOXWR'UToday's Date: 03/02/2015 Reason for consult: Initial assessment   Maternal Data Has patient been taught Hand Expression?: Yes Does the patient have breastfeeding experience prior to this delivery?: No  Feeding Feeding Type: Breast Fed Length of feed: 29 min  LATCH Score/Interventions Latch: Grasps breast easily, tongue down, lips flanged, rhythmical sucking. Intervention(s): Assist with latch;Breast massage;Breast compression  Audible Swallowing: A few with stimulation  Type of Nipple: Everted at rest and after stimulation  Comfort (Breast/Nipple): Filling, red/small blisters or bruises, mild/mod discomfort  Problem noted: Mild/Moderate discomfort Interventions (Mild/moderate discomfort): Hand expression;Comfort gels  Hold (Positioning): Assistance needed to correctly position infant at breast and maintain latch.  LATCH Score: 7  Lactation Tools Discussed/Used     Consult Status Consult Status: Follow-up Date: 03/03/15 Follow-up type: In-patient    Dahlia ByesBerkelhammer, Pamela Intrieri [facility] 03/02/2015, 6:18 PM

## 2015-03-03 MED ORDER — IBUPROFEN 600 MG PO TABS: 600.0000 mg | ORAL_TABLET | Freq: Four times a day (QID) | ORAL | Status: DC

## 2015-03-03 NOTE — Lactation Note (Signed)
This note was copied from the chart of Shannon [place]. Lactation Consultation Note: infant is at 8 % weight loss this am. Mother states that infant is feeding well. She is feeling breast changes and hearing infant swallow . Mother has a follow up appt scheduled with Peds on Thursday. Mother describes slight nipple tenderness on the left nipple. No cracks, tissue intact. Discussed that milk volume will increase with in 24 hours as her breast become more full and heavy. Advised mother in treatment to prevent severe engorgement. Advised mother to feed infant frequently and with all feeding cues. Advised in keeping accurate wet and dirty charts. Staff nurse to give hand pump with instructions . Mother receptive to all teaching. Informed  Mother of all LC services and community support.   Patient Name: Shannon Graham XBMWU'XToday's Date: 03/03/2015 Reason for consult: Follow-up assessment   Maternal Data    Feeding Feeding Type: Breast Fed Length of feed: 25 min  LATCH Score/Interventions Latch: Grasps breast easily, tongue down, lips flanged, rhythmical sucking.  Audible Swallowing: A few with stimulation  Type of Nipple: Everted at rest and after stimulation  Comfort (Breast/Nipple): Filling, red/small blisters or bruises, mild/mod discomfort  Interventions (Mild/moderate discomfort): Hand massage;Hand expression  Hold (Positioning): No assistance needed to correctly position infant at breast.  LATCH Score: 8  Lactation Tools Discussed/Used     Consult Status Consult Status: Complete    Michel BickersKendrick, Rishon Thilges McCoy 03/03/2015, 12:09 PM

## 2015-03-03 NOTE — Progress Notes (Signed)
Patient ID: Shannon Graham, female   DOB: Jun 15, 1986, 29 y.o.   MRN: [id number] PPD # 2 SVD  S:  Reports feeling well             Tolerating po/ No nausea or vomiting             Bleeding is light             Pain controlled with ibuprofen (OTC)             Up ad lib / ambulatory / voiding without difficulties    Newborn  Information for the patient's newborn:  [name], Girl Delice Bisonara [[id number]][[id number]]  female  breast feeding  O:  A & O x 3, in no apparent distress              VS:  Filed Vitals:   03/01/15 1800 03/02/15 0646 03/02/15 1753 03/03/15 0633  BP: 98/59 98/66 108/70 105/77  Pulse: 92 78 88 86  Temp: 99 F (37.2 C) 98.2 F (36.8 C) 99.1 F (37.3 C) 98.6 F (37 C)  TempSrc: Oral Oral Oral Oral  Resp: 18 18 18 17   Height:      Weight:      SpO2:  100%      LABS:  Recent Labs  03/01/15 0255 03/02/15 0550  WBC 11.1* 11.0*  HGB 12.1 10.4*  HCT 36.9 31.9*  PLT 195 173    Blood type:O POS (06/05 0255)  Rubella: Immune (10/09 0000)     Lungs: Clear and unlabored  Heart: regular rate and rhythm / no murmurs  Abdomen: soft, non-tender, non-distended              Fundus: firm, non-tender, U-1  Perineum: 2nd degree repair healing well, no edema  Lochia: minimal  Extremities: no edema, no calf pain or tenderness, no Homans    A/P: PPD # 2  29 y.o., [id number]   Principal Problem:    Postpartum care following vaginal delivery (6/5)     Doing well - stable status  Routine post partum orders  D/C home today  Arita MissAWSON, Milta Croson, M, MSN, CNM 03/03/2015, 8:44 AM

## 2015-03-03 NOTE — Discharge Summary (Signed)
Obstetric Discharge Summary Reason for Admission: onset of labor Prenatal Procedures: ultrasound Intrapartum Procedures: spontaneous vaginal delivery Postpartum Procedures: none Complications-Operative and Postpartum: 2nd degree perineal laceration HEMOGLOBIN  Date Value Ref Range Status  03/02/2015 10.4* 12.0 - 15.0 g/dL Final   HCT  Date Value Ref Range Status  03/02/2015 31.9* 36.0 - 46.0 % Final    Physical Exam:  General: alert, cooperative, appears stated age and no distress Lochia: appropriate Uterine Fundus: firm, midline, U-1 Perineum: healing well, no significant drainage, no dehiscence, no significant erythema DVT Evaluation: No evidence of DVT seen on physical exam. Negative Homan's sign. No cords or calf tenderness. No significant calf/ankle edema.  Discharge Diagnoses: Term Pregnancy-delivered  Discharge Information: Date: 03/03/2015 Activity: pelvic rest Diet: routine Medications: PNV and Ibuprofen Condition: stable Instructions: refer to practice specific booklet Discharge to: home Follow-up Information    Follow up with MODY,VAISHALI R, MD. Schedule an appointment as soon as possible for a visit in 6 weeks.   Specialty:  Obstetrics and Gynecology   Why:  postpartum visit   Contact information:   [address] [place] [id number] [phone]       Newborn Data: Live born female on 03/01/2015 Birth Weight: 7 lb 4.6 oz (3306 g) APGAR: 8, 9  Home with mother.  Raelyn MoraAWSON, Casi Westerfeld, M MSN, CNM 03/03/2015, 8:57 AM

## 2015-03-03 NOTE — Discharge Instructions (Signed)
Breast Pumping Tips °If you are breastfeeding, there may be times when you cannot feed your baby directly. Returning to work or going on a trip are common examples. Pumping allows you to store breast milk and feed it to your baby later.  °You may not get much milk when you first start to pump. Your breasts should start to make more after a few days. If you pump at the times you usually feed your baby, you may be able to keep making enough milk to feed your baby without also using formula. The more often you pump, the more milk you will produce.  °WHEN SHOULD I PUMP?  °· You can begin to pump soon after delivery. However, some experts recommend waiting about 4 weeks before giving your infant a bottle to make sure breastfeeding is going well.  °· If you plan to return to work, begin pumping a few weeks before. This will help you develop techniques that work best for you. It also lets you build up a supply of breast milk.   °· When you are with your infant, feed on demand and pump after each feeding.   °· When you are away from your infant for several hours, pump for about 15 minutes every 2-3 hours. Pump both breasts at the same time if you can.   °· If your infant has a formula feeding, make sure to pump around the same time.     °· If you drink any alcohol, wait 2 hours before pumping.   °HOW DO I PREPARE TO PUMP? °Your let-down reflex is the natural reaction to stimulation that makes your breast milk flow. It is easier to stimulate this reflex when you are relaxed. Find relaxation techniques that work for you. If you have difficulty with your let-down reflex, try these methods:  °· Smell one of your infant's blankets or an item of clothing.   °· Look at a picture or video of your infant.   °· Sit in a quiet, private space.   °· Massage the breast you plan to pump.   °· Place soothing warmth on the breast.   °· Play relaxing music.   °WHAT ARE SOME GENERAL BREAST PUMPING TIPS? °· Wash your hands before you pump. You  do not need to wash your nipples or breasts. °· There are three ways to pump. °¨ You can use your hand to massage and compress your breast. °¨ You can use a handheld manual pump. °¨ You can use an electric pump.   °· Make sure the suction cup (flange) on the breast pump is the right size. Place the flange directly over the nipple. If it is the wrong size or placed the wrong way, it may be painful and cause nipple damage.   °· If pumping is uncomfortable, apply a small amount of purified or modified lanolin to your nipple and areola. °· If you are using an electric pump, adjust the speed and suction power to be more comfortable. °· If pumping is painful or if you are not getting very much milk, you may need a different type of pump. A lactation consultant can help you determine what type of pump to use.   °· Keep a full water bottle near you at all times. Drinking lots of fluid helps you make more milk.  °· You can store your milk to use later. Pumped breast milk can be stored in a sealable, sterile container or plastic bag. Label all stored breast milk with the date you pumped it. °¨ Milk can stay out at room temperature for up to 8 hours. °¨   You can store your milk in the refrigerator for up to 8 days. °¨ You can store your milk in the freezer for 3 months. Thaw frozen milk using warm water. Do not put it in the microwave. °· Do not smoke. Smoking can lower your milk supply and harm your infant. If you need help quitting, ask your health care provider to recommend a program.   °WHEN SHOULD I CALL MY HEALTH CARE PROVIDER OR A LACTATION CONSULTANT? °· You are having trouble pumping. °· You are concerned that you are not making enough milk. °· You have nipple pain, soreness, or redness. °· You want to use birth control. Birth control pills may lower your milk supply. Talk to your health care provider about your options. °Document Released: 03/02/2010 Document Revised: 09/17/2013 Document Reviewed:  07/05/2013 °Exit[employer]® Patient Information ©2015 [employer]. This information is not intended to replace advice given to you by your health care provider. Make sure you discuss any questions you have with your health care provider. ° °Nutrition for the New Mother  °A new mother needs good health and nutrition so she can have energy to take care of a new baby. Whether a mother breastfeeds or formula feeds the baby, it is important to have a well-balanced diet. Foods from all the food groups should be chosen to meet the new mother's energy needs and to give her the nutrients needed for repair and healing.  °A HEALTHY EATING PLAN °The My [employer] plan for Moms outlines what you should eat to help you and your baby stay healthy. The energy and amount of food you need depends on whether or not you are breastfeeding. If you are breastfeeding you will need more nutrients. If you choose not to breastfeed, your nutrition goal should be to return to a healthy weight. Limiting calories may be needed if you are not breastfeeding.  °HOME CARE INSTRUCTIONS  °· For a personal plan based on your unique needs, see your Registered Dietitian or visit www.mypyramid.gov. °· Eat a variety of foods. The plan below will help guide you. The following chart has a suggested daily meal plan from the [employer] for Moms. °· Eat a variety of fruits and vegetables. °· Eat more dark green and orange vegetables and cooked dried beans. °· Make half your grains whole grains. Choose whole instead of refined grains. °· Choose low-fat or lean meats and poultry. °· Choose low-fat or fat-free dairy products like milk, cheese, or yogurt. °Fruits °· Breastfeeding: 2 cups °· Non-Breastfeeding: 2 cups °· What Counts as a serving? °¨ 1 cup of fruit or juice. °¨ ½ cup dried fruit. °Vegetables °· Breastfeeding: 3 cups °· Non-Breastfeeding: 2 ½ cups °· What Counts as a serving? °¨ 1 cup raw or cooked vegetables. °¨ Juice or 2 cups raw leafy  vegetables. °Grains °· Breastfeeding: 8 oz °· Non-Breastfeeding: 6 oz °· What Counts as a serving? °¨ 1 slice bread. °¨ 1 oz ready-to-eat cereal. °¨ ½ cup cooked pasta, rice, or cereal. °Meat and Beans °· Breastfeeding: 6 ½ oz °· Non-Breastfeeding: 5 ½ oz °· What Counts as a serving? °¨ 1 oz lean meat, poultry, or fish °¨ ¼ cup cooked dry beans °¨ ½ oz nuts or 1 egg °¨ 1 tbs peanut butter °Milk °· Breastfeeding: 3 cups °· Non-Breastfeeding: 3 cups °· What Counts as a serving? °¨ 1 cup milk. °¨ 8 oz yogurt. °¨ 1 ½ oz cheese. °¨ 2 oz processed cheese. °TIPS FOR THE BREASTFEEDING MOM °· Rapid weight   loss is not suggested when you are breastfeeding. By simply breastfeeding, you will be able to lose the weight gained during your pregnancy. Your caregiver can keep track of your weight and tell you if your weight loss is appropriate. °· Be sure to drink fluids. You may notice that you are thirstier than usual. A suggestion is to drink a glass of water or other beverage whenever you breastfeed. °· Avoid alcohol as it can be passed into your breast milk. °· Limit caffeine drinks to no more than 2 to 3 cups per day. °· You may need to keep taking your prenatal vitamin while you are breastfeeding. Talk with your caregiver about taking a vitamin or supplement. °RETURING TO A HEALTHY WEIGHT °· The My [employer] Plan for Moms will help you return to a healthy weight. It will also provide the nutrients you need. °· You may need to limit "empty" calories. These include: °¨ High fat foods like fried foods, fatty meats, fast food, butter, and mayonnaise. °¨ High sugar foods like sodas, jelly, candy, and sweets. °· Be physically active. Include 30 minutes of exercise or more each day. Choose an activity you like such as walking, swimming, biking, or aerobics. Check with your caregiver before you start to exercise. °Document Released: 12/20/2007 Document Revised: 12/05/2011 Document Reviewed: 12/20/2007 °ExitCare® Patient Information  ©2015 [employer]. This information is not intended to replace advice given to you by your health care provider. Make sure you discuss any questions you have with your health care provider. °Postpartum Depression and Baby Blues °The postpartum period begins right after the birth of a baby. During this time, there is often a great amount of joy and excitement. It is also a time of many changes in the life of the parents. Regardless of how many times a mother gives birth, each child brings new challenges and dynamics to the family. It is not unusual to have feelings of excitement along with confusing shifts in moods, emotions, and thoughts. All mothers are at risk of developing postpartum depression or the "baby blues." These mood changes can occur right after giving birth, or they may occur many months after giving birth. The baby blues or postpartum depression can be mild or severe. Additionally, postpartum depression can go away rather quickly, or it can be a long-term condition.  °CAUSES °Raised hormone levels and the rapid drop in those levels are thought to be a main cause of postpartum depression and the baby blues. A number of hormones change during and after pregnancy. Estrogen and progesterone usually decrease right after the delivery of your baby. The levels of thyroid hormone and various cortisol steroids also rapidly drop. Other factors that play a role in these mood changes include major life events and genetics.  °RISK FACTORS °If you have any of the following risks for the baby blues or postpartum depression, know what symptoms to watch out for during the postpartum period. Risk factors that may increase the likelihood of getting the baby blues or postpartum depression include: °· Having a personal or family history of depression.   °· Having depression while being pregnant.   °· Having premenstrual mood issues or mood issues related to oral contraceptives. °· Having a lot of life stress.   °· Having  marital conflict.   °· Lacking a social support network.   °· Having a baby with special needs.   °· Having health problems, such as diabetes.   °SIGNS AND SYMPTOMS °Symptoms of baby blues include: °· Brief changes in mood, such as going   from extreme happiness to sadness. °· Decreased concentration.   °· Difficulty sleeping.   °· Crying spells, tearfulness.   °· Irritability.   °· Anxiety.   °Symptoms of postpartum depression typically begin within the first month after giving birth. These symptoms include: °· Difficulty sleeping or excessive sleepiness.   °· Marked weight loss.   °· Agitation.   °· Feelings of worthlessness.   °· Lack of interest in activity or food.   °Postpartum psychosis is a very serious condition and can be dangerous. Fortunately, it is rare. Displaying any of the following symptoms is cause for immediate medical attention. Symptoms of postpartum psychosis include:  °· Hallucinations and delusions.   °· Bizarre or disorganized behavior.   °· Confusion or disorientation.   °DIAGNOSIS  °A diagnosis is made by an evaluation of your symptoms. There are no medical or lab tests that lead to a diagnosis, but there are various questionnaires that a health care provider may use to identify those with the baby blues, postpartum depression, or psychosis. Often, a screening tool called the Edinburgh Postnatal Depression Scale is used to diagnose depression in the postpartum period.  °TREATMENT °The baby blues usually goes away on its own in 1-2 weeks. Social support is often all that is needed. You will be encouraged to get adequate sleep and rest. Occasionally, you may be given medicines to help you sleep.  °Postpartum depression requires treatment because it can last several months or longer if it is not treated. Treatment may include individual or group therapy, medicine, or both to address any social, physiological, and psychological factors that may play a role in the depression. Regular exercise, a  healthy diet, rest, and social support may also be strongly recommended.  °Postpartum psychosis is more serious and needs treatment right away. Hospitalization is often needed. °HOME CARE INSTRUCTIONS °· Get as much rest as you can. Nap when the baby sleeps.   °· Exercise regularly. Some women find yoga and walking to be beneficial.   °· Eat a balanced and nourishing diet.   °· Do little things that you enjoy. Have a cup of tea, take a bubble bath, read your favorite magazine, or listen to your favorite music. °· Avoid alcohol.   °· Ask for help with household chores, cooking, grocery shopping, or running errands as needed. Do not try to do everything.   °· Talk to people close to you about how you are feeling. Get support from your partner, family members, friends, or other new moms. °· Try to stay positive in how you think. Think about the things you are grateful for.   °· Do not spend a lot of time alone.   °· Only take over-the-counter or prescription medicine as directed by your health care provider. °· Keep all your postpartum appointments.   °· Let your health care provider know if you have any concerns.   °SEEK MEDICAL CARE IF: °You are having a reaction to or problems with your medicine. °SEEK IMMEDIATE MEDICAL CARE IF: °· You have suicidal feelings.   °· You think you may harm the baby or someone else. °MAKE SURE YOU: °· Understand these instructions. °· Will watch your condition. °· Will get help right away if you are not doing well or get worse. °Document Released: 06/16/2004 Document Revised: 09/17/2013 Document Reviewed: 06/24/2013 °Exit[employer]® Patient Information ©2015 [employer]. This information is not intended to replace advice given to you by your health care provider. Make sure you discuss any questions you have with your health care provider. °Breastfeeding and Mastitis °Mastitis is inflammation of the breast tissue. It can occur in women who   are breastfeeding. This can make breastfeeding  painful. Mastitis will sometimes go away on its own. Your health care provider will help determine if treatment is needed. °CAUSES °Mastitis is often associated with a blocked milk (lactiferous) duct. This can happen when too much milk builds up in the breast. Causes of excess milk in the breast can include: °· Poor latch-on. If your baby is not latched onto the breast properly, she or he may not empty your breast completely while breastfeeding. °· Allowing too much time to pass between feedings. °· Wearing a bra or other clothing that is too tight. This puts extra pressure on the lactiferous ducts so milk does not flow through them as it should. °Mastitis can also be caused by a bacterial infection. Bacteria may enter the breast tissue through cuts or openings in the skin. In women who are breastfeeding, this may occur because of cracked or irritated skin. Cracks in the skin are often caused when your baby does not latch on properly to the breast. °SIGNS AND SYMPTOMS °· Swelling, redness, tenderness, and pain in an area of the breast. °· Swelling of the glands under the arm on the same side. °· Fever may or may not accompany mastitis. °If an infection is allowed to progress, a collection of pus (abscess) may develop. °DIAGNOSIS  °Your health care provider can usually diagnose mastitis based on your symptoms and a physical exam. Tests may be done to help confirm the diagnosis. These may include: °· Removal of pus from the breast by applying pressure to the area. This pus can be examined in the lab to determine which bacteria are present. If an abscess has developed, the fluid in the abscess can be removed with a needle. This can also be used to confirm the diagnosis and determine the bacteria present. In most cases, pus will not be present. °· Blood tests to determine if your body is fighting a bacterial infection. °· Mammogram or ultrasound tests to rule out other problems or diseases. °TREATMENT  °Mastitis that  occurs with breastfeeding will sometimes go away on its own. Your health care provider may choose to wait 24 hours after first seeing you to decide whether a prescription medicine is needed. If your symptoms are worse after 24 hours, your health care provider will likely prescribe an antibiotic medicine to treat the mastitis. He or she will determine which bacteria are most likely causing the infection and will then select an appropriate antibiotic medicine. This is sometimes changed based on the results of tests performed to identify the bacteria, or if there is no response to the antibiotic medicine selected. Antibiotic medicines are usually given by mouth. You may also be given medicine for pain. °HOME CARE INSTRUCTIONS °· Only take over-the-counter or prescription medicines for pain, fever, or discomfort as directed by your health care provider. °· If your health care provider prescribed an antibiotic medicine, take the medicine as directed. Make sure you finish it even if you start to feel better. °· Do not wear a tight or underwire bra. Wear a soft, supportive bra. °· Increase your fluid intake, especially if you have a fever. °· Continue to empty the breast. Your health care provider can tell you whether this milk is safe for your infant or needs to be thrown out. You may be told to stop nursing until your health care provider thinks it is safe for your baby. Use a breast pump if you are advised to stop nursing. °· Keep your nipples   clean and dry. °· Empty the first breast completely before going to the other breast. If your baby is not emptying your breasts completely for some reason, use a breast pump to empty your breasts. °· If you go back to work, pump your breasts while at work to stay in time with your nursing schedule. °· Avoid allowing your breasts to become overly filled with milk (engorged). °SEEK MEDICAL CARE IF: °· You have pus-like discharge from the breast. °· Your symptoms do not improve with  the treatment prescribed by your health care provider within 2 days. °SEEK IMMEDIATE MEDICAL CARE IF: °· Your pain and swelling are getting worse. °· You have pain that is not controlled with medicine. °· You have a red line extending from the breast toward your armpit. °· You have a fever or persistent symptoms for more than 2-3 days. °· You have a fever and your symptoms suddenly get worse. °MAKE SURE YOU:  °· Understand these instructions. °· Will watch your condition. °· Will get help right away if you are not doing well or get worse. °Document Released: 01/07/2005 Document Revised: 09/17/2013 Document Reviewed: 04/18/2013 °[employer]® Patient Information ©2015 [employer]. This information is not intended to replace advice given to you by your health care provider. Make sure you discuss any questions you have with your health care provider. °Breastfeeding °Deciding to breastfeed is one of the best choices you can make for you and your baby. A change in hormones during pregnancy causes your breast tissue to grow and increases the number and size of your milk ducts. These hormones also allow proteins, sugars, and fats from your blood supply to make breast milk in your milk-producing glands. Hormones prevent breast milk from being released before your baby is born as well as prompt milk flow after birth. Once breastfeeding has begun, thoughts of your baby, as well as his or her sucking or crying, can stimulate the release of milk from your milk-producing glands.  °BENEFITS OF BREASTFEEDING °For Your Baby °· Your first milk (colostrum) helps your baby's digestive system function better.   °· There are antibodies in your milk that help your baby fight off infections.   °· Your baby has a lower incidence of asthma, allergies, and sudden infant death syndrome.   °· The nutrients in breast milk are better for your baby than infant formulas and are designed uniquely for your baby's needs.   °· Breast milk improves your  baby's brain development.   °· Your baby is less likely to develop other conditions, such as childhood obesity, asthma, or type 2 diabetes mellitus.   °For You  °· Breastfeeding helps to create a very special bond between you and your baby.   °· Breastfeeding is convenient. Breast milk is always available at the correct temperature and costs nothing.   °· Breastfeeding helps to burn calories and helps you lose the weight gained during pregnancy.   °· Breastfeeding makes your uterus contract to its prepregnancy size faster and slows bleeding (lochia) after you give birth.   °· Breastfeeding helps to lower your risk of developing type 2 diabetes mellitus, osteoporosis, and breast or ovarian cancer later in life. °SIGNS THAT YOUR BABY IS HUNGRY °Early Signs of Hunger  °· Increased alertness or activity. °· Stretching. °· Movement of the head from side to side. °· Movement of the head and opening of the mouth when the corner of the mouth or cheek is stroked (rooting). °· Increased sucking sounds, smacking lips, cooing, sighing, or squeaking. °· Hand-to-mouth movements. °· Increased sucking of   fingers or hands. °Late Signs of Hunger °· Fussing. °· Intermittent crying. °Extreme Signs of Hunger °Signs of extreme hunger will require calming and consoling before your baby will be able to breastfeed successfully. Do not wait for the following signs of extreme hunger to occur before you initiate breastfeeding:   °· Restlessness. °· A loud, strong cry. °·  Screaming. °BREASTFEEDING BASICS °Breastfeeding Initiation °· Find a comfortable place to sit or lie down, with your neck and back well supported. °· Place a pillow or rolled up blanket under your baby to bring him or her to the level of your breast (if you are seated). Nursing pillows are specially designed to help support your arms and your baby while you breastfeed. °· Make sure that your baby's abdomen is facing your abdomen.   °· Gently massage your breast. With your  fingertips, massage from your chest wall toward your nipple in a circular motion. This encourages milk flow. You may need to continue this action during the feeding if your milk flows slowly. °· Support your breast with 4 fingers underneath and your thumb above your nipple. Make sure your fingers are well away from your nipple and your baby's mouth.   °· Stroke your baby's lips gently with your finger or nipple.   °· When your baby's mouth is open wide enough, quickly bring your baby to your breast, placing your entire nipple and as much of the colored area around your nipple (areola) as possible into your baby's mouth.   °¨ More areola should be visible above your baby's upper lip than below the lower lip.   °¨ Your baby's tongue should be between his or her lower gum and your breast.   °· Ensure that your baby's mouth is correctly positioned around your nipple (latched). Your baby's lips should create a seal on your breast and be turned out (everted). °· It is common for your baby to suck about 2-3 minutes in order to start the flow of breast milk. °Latching °Teaching your baby how to latch on to your breast properly is very important. An improper latch can cause nipple pain and decreased milk supply for you and poor weight gain in your baby. Also, if your baby is not latched onto your nipple properly, he or she may swallow some air during feeding. This can make your baby fussy. Burping your baby when you switch breasts during the feeding can help to get rid of the air. However, teaching your baby to latch on properly is still the best way to prevent fussiness from swallowing air while breastfeeding. °Signs that your baby has successfully latched on to your nipple:    °· Silent tugging or silent sucking, without causing you pain.   °· Swallowing heard between every 3-4 sucks.   °·  Muscle movement above and in front of his or her ears while sucking.   °Signs that your baby has not successfully latched on to  nipple:  °· Sucking sounds or smacking sounds from your baby while breastfeeding. °· Nipple pain. °If you think your baby has not latched on correctly, slip your finger into the corner of your baby's mouth to break the suction and place it between your baby's gums. Attempt breastfeeding initiation again. °Signs of Successful Breastfeeding °Signs from your baby:   °· A gradual decrease in the number of sucks or complete cessation of sucking.   °· Falling asleep.   °· Relaxation of his or her body.   °· Retention of a small amount of milk in his or her mouth.   °· Letting go   of your breast by himself or herself. °Signs from you: °· Breasts that have increased in firmness, weight, and size 1-3 hours after feeding.   °· Breasts that are softer immediately after breastfeeding. °· Increased milk volume, as well as a change in milk consistency and color by the fifth day of breastfeeding.   °· Nipples that are not sore, cracked, or bleeding. °Signs That Your Baby is Getting Enough Milk °· Wetting at least 3 diapers in a 24-hour period. The urine should be clear and pale yellow by age 5 days. °· At least 3 stools in a 24-hour period by age 5 days. The stool should be soft and yellow. °· At least 3 stools in a 24-hour period by age 7 days. The stool should be seedy and yellow. °· No loss of weight greater than 10% of birth weight during the first 3 days of age. °· Average weight gain of 4-7 ounces (113-198 g) per week after age 4 days. °· Consistent daily weight gain by age 5 days, without weight loss after the age of 2 weeks. °After a feeding, your baby may spit up a small amount. This is common. °BREASTFEEDING FREQUENCY AND DURATION °Frequent feeding will help you make more milk and can prevent sore nipples and breast engorgement. Breastfeed when you feel the need to reduce the fullness of your breasts or when your baby shows signs of hunger. This is called "breastfeeding on demand." Avoid introducing a pacifier to your  baby while you are working to establish breastfeeding (the first 4-6 weeks after your baby is born). After this time you may choose to use a pacifier. Research has shown that pacifier use during the first year of a baby's life decreases the risk of sudden infant death syndrome (SIDS). °Allow your baby to feed on each breast as long as he or she wants. Breastfeed until your baby is finished feeding. When your baby unlatches or falls asleep while feeding from the first breast, offer the second breast. Because newborns are often sleepy in the first few weeks of life, you may need to awaken your baby to get him or her to feed. °Breastfeeding times will vary from baby to baby. However, the following rules can serve as a guide to help you ensure that your baby is properly fed: °· Newborns (babies 4 weeks of age or younger) may breastfeed every 1-3 hours. °· Newborns should not go longer than 3 hours during the day or 5 hours during the night without breastfeeding. °· You should breastfeed your baby a minimum of 8 times in a 24-hour period until you begin to introduce solid foods to your baby at around 6 months of age. °BREAST MILK PUMPING °Pumping and storing breast milk allows you to ensure that your baby is exclusively fed your breast milk, even at times when you are unable to breastfeed. This is especially important if you are going back to work while you are still breastfeeding or when you are not able to be present during feedings. Your lactation consultant can give you guidelines on how long it is safe to store breast milk.  °A breast pump is a machine that allows you to pump milk from your breast into a sterile bottle. The pumped breast milk can then be stored in a refrigerator or freezer. Some breast pumps are operated by hand, while others use electricity. Ask your lactation consultant which type will work best for you. Breast pumps can be purchased, but some hospitals and breastfeeding support groups   lease  breast pumps on a monthly basis. A lactation consultant can teach you how to hand express breast milk, if you prefer not to use a pump.  °CARING FOR YOUR BREASTS WHILE YOU BREASTFEED °Nipples can become dry, cracked, and sore while breastfeeding. The following recommendations can help keep your breasts moisturized and healthy: °· Avoid using soap on your nipples.   °· Wear a supportive bra. Although not required, special nursing bras and tank tops are designed to allow access to your breasts for breastfeeding without taking off your entire bra or top. Avoid wearing underwire-style bras or extremely tight bras. °· Air dry your nipples for 3-4 minutes after each feeding.   °· Use only cotton bra pads to absorb leaked breast milk. Leaking of breast milk between feedings is normal.   °· Use lanolin on your nipples after breastfeeding. Lanolin helps to maintain your skin's normal moisture barrier. If you use pure lanolin, you do not need to wash it off before feeding your baby again. Pure lanolin is not toxic to your baby. You may also hand express a few drops of breast milk and gently massage that milk into your nipples and allow the milk to air dry. °In the first few weeks after giving birth, some women experience extremely full breasts (engorgement). Engorgement can make your breasts feel heavy, warm, and tender to the touch. Engorgement peaks within 3-5 days after you give birth. The following recommendations can help ease engorgement: °· Completely empty your breasts while breastfeeding or pumping. You may want to start by applying warm, moist heat (in the shower or with warm water-soaked hand towels) just before feeding or pumping. This increases circulation and helps the milk flow. If your baby does not completely empty your breasts while breastfeeding, pump any extra milk after he or she is finished. °· Wear a snug bra (nursing or regular) or tank top for 1-2 days to signal your body to slightly decrease milk  production. °· Apply ice packs to your breasts, unless this is too uncomfortable for you. °· Make sure that your baby is latched on and positioned properly while breastfeeding. °If engorgement persists after 48 hours of following these recommendations, contact your health care provider or a lactation consultant. °OVERALL HEALTH CARE RECOMMENDATIONS WHILE BREASTFEEDING °· Eat healthy foods. Alternate between meals and snacks, eating 3 of each per day. Because what you eat affects your breast milk, some of the foods may make your baby more irritable than usual. Avoid eating these foods if you are sure that they are negatively affecting your baby. °· Drink milk, fruit juice, and water to satisfy your thirst (about 10 glasses a day).   °· Rest often, relax, and continue to take your prenatal vitamins to prevent fatigue, stress, and anemia. °· Continue breast self-awareness checks. °· Avoid chewing and smoking tobacco. °· Avoid alcohol and drug use. °Some medicines that may be harmful to your baby can pass through breast milk. It is important to ask your health care provider before taking any medicine, including all over-the-counter and prescription medicine as well as vitamin and herbal supplements. °It is possible to become pregnant while breastfeeding. If birth control is desired, ask your health care provider about options that will be safe for your baby. °SEEK MEDICAL CARE IF:  °· You feel like you want to stop breastfeeding or have become frustrated with breastfeeding. °· You have painful breasts or nipples. °· Your nipples are cracked or bleeding. °· Your breasts are red, tender, or warm. °· You have   a swollen area on either breast. °· You have a fever or chills. °· You have nausea or vomiting. °· You have drainage other than breast milk from your nipples. °· Your breasts do not become full before feedings by the fifth day after you give birth. °· You feel sad and depressed. °· Your baby is too sleepy to eat  well. °· Your baby is having trouble sleeping.   °· Your baby is wetting less than 3 diapers in a 24-hour period. °· Your baby has less than 3 stools in a 24-hour period. °· Your baby's skin or the white part of his or her eyes becomes yellow.   °· Your baby is not gaining weight by 5 days of age. °SEEK IMMEDIATE MEDICAL CARE IF:  °· Your baby is overly tired (lethargic) and does not want to wake up and feed. °· Your baby develops an unexplained fever. °Document Released: 09/12/2005 Document Revised: 09/17/2013 Document Reviewed: 03/06/2013 °[employer]® Patient Information ©2015 [employer]. This information is not intended to replace advice given to you by your health care provider. Make sure you discuss any questions you have with your health care provider. ° °

## 2015-03-05 ENCOUNTER — Inpatient Hospital Stay (HOSPITAL_COMMUNITY): Payer: BC Managed Care – PPO

## 2023-06-01 ENCOUNTER — Ambulatory Visit (INDEPENDENT_AMBULATORY_CARE_PROVIDER_SITE_OTHER): Payer: BC Managed Care – PPO

## 2023-06-01 ENCOUNTER — Encounter: Payer: Self-pay | Admitting: Podiatry

## 2023-06-01 ENCOUNTER — Ambulatory Visit: Payer: BC Managed Care – PPO | Admitting: Podiatry

## 2023-06-01 DIAGNOSIS — Q666 Other congenital valgus deformities of feet: Secondary | ICD-10-CM

## 2023-06-01 DIAGNOSIS — M76821 Posterior tibial tendinitis, right leg: Secondary | ICD-10-CM

## 2023-06-01 DIAGNOSIS — M778 Other enthesopathies, not elsewhere classified: Secondary | ICD-10-CM

## 2023-06-01 DIAGNOSIS — M722 Plantar fascial fibromatosis: Secondary | ICD-10-CM

## 2023-06-01 MED ORDER — METHYLPREDNISOLONE 4 MG PO TBPK: ORAL_TABLET | ORAL | 0 refills | Status: AC

## 2023-06-01 MED ORDER — MELOXICAM 15 MG PO TABS: 15.0000 mg | ORAL_TABLET | Freq: Every day | ORAL | 3 refills | Status: AC

## 2023-06-01 MED ORDER — TRIAMCINOLONE ACETONIDE 40 MG/ML IJ SUSP
20.0000 mg | Freq: Once | INTRAMUSCULAR | Status: AC
  Administered 2023-06-01: 20 mg

## 2023-06-01 NOTE — Progress Notes (Signed)
  Subjective:  Patient ID: Shannon Graham, female    DOB: [birth year],  MRN: [id number] HPI Chief Complaint  Patient presents with   Foot Pain    Medial foot/ankle right - aching x several months, notices AM pain, ankle feels weak, tried massaging, rolling arch on a can, soaking, tylenol-not helping   New Patient (Initial Visit)    37 y.o. female presents with the above complaint.   ROS: Denies fever chills nausea vomit muscle aches pains calf pain back pain chest pain shortness of breath.  No past medical history on file. Past Surgical History:  Procedure Laterality Date   WISDOM TOOTH EXTRACTION      Current Outpatient Medications:    ELDERBERRY PO, Take by mouth., Disp: , Rfl:    meloxicam (MOBIC) 15 MG tablet, Take 1 tablet (15 mg total) by mouth daily., Disp: 30 tablet, Rfl: 3   methylPREDNISolone (MEDROL DOSEPAK) 4 MG TBPK tablet, 6 day dose pack - take as directed, Disp: 21 tablet, Rfl: 0   acetaminophen (TYLENOL) 500 MG tablet, Take 1,000 mg by mouth every 6 (six) hours as needed for mild pain or headache., Disp: , Rfl:    Prenatal Vit-Fe Fumarate-FA (PRENATAL MULTIVITAMIN) TABS tablet, Take 1 tablet by mouth at bedtime. , Disp: , Rfl:   Allergies  Allergen Reactions   Other Swelling    Tongue swelling with big red gum   Review of Systems Objective:  There were no vitals filed for this visit.  General: Well developed, nourished, in no acute distress, alert and oriented x3   Dermatological: Skin is warm, dry and supple bilateral. Nails x 10 are well maintained; remaining integument appears unremarkable at this time. There are no open sores, no preulcerative lesions, no rash or signs of infection present.  Vascular: Dorsalis Pedis artery and Posterior Tibial artery pedal pulses are 2/4 bilateral with immedate capillary fill time. Pedal hair growth present. No varicosities and no lower extremity edema present bilateral.   Neruologic: Grossly intact via light touch  bilateral. Vibratory intact via tuning fork bilateral. Protective threshold with Semmes Wienstein monofilament intact to all pedal sites bilateral. Patellar and Achilles deep tendon reflexes 2+ bilateral. No Babinski or clonus noted bilateral.   Musculoskeletal: No gross boney pedal deformities bilateral. No pain, crepitus, or limitation noted with foot and ankle range of motion bilateral. Muscular strength 5/5 in all groups tested bilateral.  Flexible pes planus is noted right foot.  Posterior tibial tendinitis with some fluctuance on palpation of the tendon sheath.  She does also have pain on palpation medial calcaneal tubercle of the right heel.  Gait: Unassisted, Nonantalgic.    Radiographs:  Radiographs taken today demonstrate osseously mature individual good bone mineralization.  Pes planovalgus is noted in this right foot.  No significant osteoarthritic change.  Assessment & Plan:   Assessment: Pes planovalgus with plantar fasciitis right and posterior tibial tendon tendinitis right.  Plan: Discussed etiology pathology conservative surgical therapies at this point I injected the right heel 20 mg Kenalog 5 mg Marcaine point maximal tenderness.  Posterior plantar Tri-Lock brace and started her on methylprednisolone to be followed by meloxicam.  Discussed appropriate shoe gear stretching exercise ice therapy and shoe gear modifications.     Damiano Stamper T. [employer], [place]

## 2023-07-04 ENCOUNTER — Ambulatory Visit: Payer: BC Managed Care – PPO | Admitting: Podiatry

## 2023-07-06 ENCOUNTER — Ambulatory Visit: Payer: BC Managed Care – PPO | Admitting: Podiatry

## 2023-07-06 ENCOUNTER — Encounter: Payer: Self-pay | Admitting: Podiatry

## 2023-07-06 VITALS — BP 108/67 | HR 96 | Resp 18 | Ht 59.0 in | Wt 181.0 lb

## 2023-07-06 DIAGNOSIS — M76821 Posterior tibial tendinitis, right leg: Secondary | ICD-10-CM | POA: Diagnosis not present

## 2023-07-06 NOTE — Progress Notes (Signed)
She presents today for follow-up of her pes planovalgus plantar fasciitis and posterior tibial tendinitis of her right foot.  She denies fever chills nausea right muscle aches and pains states that she is 98% improved.  Continues to take her meloxicam on a regular basis.  Objective: Vital signs are stable alert and oriented x 3.  Pulses are palpable.  She has no pain on palpation of the posterior tibial tendon or the plantar fascia.  Assessment: Planter fasciitis is resolving by 98% posterior tibial tendinitis is resolving.  Pes planovalgus is still present.  Plan: May start walking and good tennis shoes which we discussed today without her brace I would like her to continue her anti-inflammatory for another month should her pain come back she is to start back in the brace and notify us.
